# Patient Record
Sex: Female | Born: 1980 | Hispanic: No | Marital: Married | Smoking: Never smoker
Health system: Southern US, Community
[De-identification: ages and names within clinical notes are randomized; demographics above are authoritative.]

## PROBLEM LIST (undated history)

## (undated) DIAGNOSIS — I872 Venous insufficiency (chronic) (peripheral): Secondary | ICD-10-CM

## (undated) DIAGNOSIS — I82409 Acute embolism and thrombosis of unspecified deep veins of unspecified lower extremity: Secondary | ICD-10-CM

## (undated) DIAGNOSIS — G43909 Migraine, unspecified, not intractable, without status migrainosus: Secondary | ICD-10-CM

---
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
## 2013-02-10 LAB — OB RESULTS CONSOLE ANTIBODY SCREEN: Antibody Screen: NEGATIVE

## 2013-02-10 LAB — OB RESULTS CONSOLE RUBELLA ANTIBODY, IGM: Rubella: IMMUNE

## 2013-02-10 LAB — OB RESULTS CONSOLE ABO/RH

## 2013-02-10 LAB — OB RESULTS CONSOLE RPR: RPR: NONREACTIVE

## 2013-02-10 LAB — OB RESULTS CONSOLE HGB/HCT, BLOOD
HCT: 32 %
Hemoglobin: 10.5 g/dL

## 2013-02-21 ENCOUNTER — Inpatient Hospital Stay (HOSPITAL_COMMUNITY): Admission: AD | Admit: 2013-02-21 | Payer: Self-pay | Source: Ambulatory Visit | Admitting: Obstetrics and Gynecology

## 2013-06-28 ENCOUNTER — Inpatient Hospital Stay (HOSPITAL_COMMUNITY): Payer: BC Managed Care – PPO

## 2013-06-28 ENCOUNTER — Encounter (HOSPITAL_COMMUNITY): Payer: Self-pay | Admitting: *Deleted

## 2013-06-28 ENCOUNTER — Inpatient Hospital Stay (HOSPITAL_COMMUNITY)
Admission: AD | Admit: 2013-06-28 | Discharge: 2013-07-07 | DRG: 775 | Disposition: A | Discharge status: Home / Self Care | Payer: BC Managed Care – PPO | Source: Ambulatory Visit | Attending: Obstetrics and Gynecology | Admitting: Obstetrics and Gynecology

## 2013-06-28 DIAGNOSIS — O9903 Anemia complicating the puerperium: Secondary | ICD-10-CM | POA: Diagnosis not present

## 2013-06-28 DIAGNOSIS — D62 Acute posthemorrhagic anemia: Secondary | ICD-10-CM | POA: Diagnosis not present

## 2013-06-28 DIAGNOSIS — O4290 Premature rupture of membranes, unspecified as to length of time between rupture and onset of labor, unspecified weeks of gestation: Principal | ICD-10-CM | POA: Diagnosis present

## 2013-06-28 HISTORY — DX: Migraine, unspecified, not intractable, without status migrainosus: G43.909

## 2013-06-28 HISTORY — DX: Venous insufficiency (chronic) (peripheral): I87.2

## 2013-06-28 HISTORY — DX: Acute embolism and thrombosis of unspecified deep veins of unspecified lower extremity: I82.409

## 2013-06-28 LAB — CBC
HCT: 30.5 % — ABNORMAL LOW (ref 36.0–46.0)
MCV: 92.1 fL (ref 78.0–100.0)
Platelets: 255 10*3/uL (ref 150–400)
RDW: 13.2 % (ref 11.5–15.5)
WBC: 12 10*3/uL — ABNORMAL HIGH (ref 4.0–10.5)

## 2013-06-28 LAB — RPR: RPR Ser Ql: NONREACTIVE

## 2013-06-28 MED ORDER — PRENATAL MULTIVITAMIN CH
1.0000 | ORAL_TABLET | Freq: Every day | ORAL | Status: DC
  Administered 2013-06-29 – 2013-07-05 (×7): 1 via ORAL
  Filled 2013-06-28 (×8): qty 1

## 2013-06-28 MED ORDER — ERYTHROMYCIN LACTOBIONATE 500 MG IV SOLR
250.0000 mg | Freq: Four times a day (QID) | INTRAVENOUS | Status: DC
  Administered 2013-06-28: 250 mg via INTRAVENOUS
  Filled 2013-06-28 (×3): qty 5

## 2013-06-28 MED ORDER — CALCIUM CARBONATE ANTACID 500 MG PO CHEW
2.0000 | CHEWABLE_TABLET | ORAL | Status: DC | PRN
  Administered 2013-06-30 – 2013-07-03 (×3): 400 mg via ORAL
  Filled 2013-06-28 (×3): qty 1

## 2013-06-28 MED ORDER — LACTATED RINGERS IV SOLN
INTRAVENOUS | Status: DC
  Administered 2013-06-28 – 2013-06-29 (×3): via INTRAVENOUS
  Administered 2013-06-29: 100 mL/h via INTRAVENOUS
  Administered 2013-07-02 – 2013-07-03 (×2): via INTRAVENOUS

## 2013-06-28 MED ORDER — MAGNESIUM SULFATE BOLUS VIA INFUSION
4.0000 g | Freq: Once | INTRAVENOUS | Status: AC
  Administered 2013-06-28: 4 g via INTRAVENOUS
  Filled 2013-06-28: qty 500

## 2013-06-28 MED ORDER — ZOLPIDEM TARTRATE 5 MG PO TABS
5.0000 mg | ORAL_TABLET | Freq: Every evening | ORAL | Status: DC | PRN
  Administered 2013-06-29: 5 mg via ORAL
  Filled 2013-06-28: qty 1

## 2013-06-28 MED ORDER — DEXTROSE 5 % IV SOLN
500.0000 mg | INTRAVENOUS | Status: AC
  Administered 2013-06-28 – 2013-06-29 (×2): 500 mg via INTRAVENOUS
  Filled 2013-06-28 (×2): qty 500

## 2013-06-28 MED ORDER — DEXTROSE 5 % IV SOLN: 500.0000 mg | Freq: Once | INTRAVENOUS | Status: DC

## 2013-06-28 MED ORDER — AMOXICILLIN 500 MG PO CAPS
500.0000 mg | ORAL_CAPSULE | Freq: Three times a day (TID) | ORAL | Status: AC
  Administered 2013-06-30 – 2013-07-05 (×14): 500 mg via ORAL
  Filled 2013-06-28 (×15): qty 1

## 2013-06-28 MED ORDER — ACETAMINOPHEN 325 MG PO TABS
650.0000 mg | ORAL_TABLET | ORAL | Status: DC | PRN
  Administered 2013-07-02 – 2013-07-05 (×3): 650 mg via ORAL
  Filled 2013-06-28 (×3): qty 2

## 2013-06-28 MED ORDER — BETAMETHASONE SOD PHOS & ACET 6 (3-3) MG/ML IJ SUSP
12.0000 mg | INTRAMUSCULAR | Status: AC
  Administered 2013-06-28 – 2013-06-29 (×2): 12 mg via INTRAMUSCULAR
  Filled 2013-06-28 (×2): qty 2

## 2013-06-28 MED ORDER — MAGNESIUM SULFATE 40 G IN LACTATED RINGERS - SIMPLE
2.0000 g/h | INTRAVENOUS | Status: DC
  Administered 2013-06-28: 2 g/h via INTRAVENOUS
  Filled 2013-06-28: qty 500

## 2013-06-28 MED ORDER — DOCUSATE SODIUM 100 MG PO CAPS
100.0000 mg | ORAL_CAPSULE | Freq: Every day | ORAL | Status: DC
  Administered 2013-06-29 – 2013-06-30 (×2): 100 mg via ORAL
  Filled 2013-06-28 (×2): qty 1

## 2013-06-28 MED ORDER — ERYTHROMYCIN BASE 250 MG PO TABS: 250.0000 mg | ORAL_TABLET | Freq: Four times a day (QID) | ORAL | Status: DC

## 2013-06-28 MED ORDER — AZITHROMYCIN 500 MG PO TABS: 500.0000 mg | ORAL_TABLET | Freq: Every day | ORAL | Status: DC

## 2013-06-28 MED ORDER — SODIUM CHLORIDE 0.9 % IV SOLN
2.0000 g | Freq: Four times a day (QID) | INTRAVENOUS | Status: AC
  Administered 2013-06-28 – 2013-06-30 (×8): 2 g via INTRAVENOUS
  Filled 2013-06-28 (×8): qty 2000

## 2013-06-28 MED ORDER — AZITHROMYCIN 500 MG PO TABS
500.0000 mg | ORAL_TABLET | Freq: Every day | ORAL | Status: AC
  Administered 2013-06-30 – 2013-07-04 (×5): 500 mg via ORAL
  Filled 2013-06-28 (×5): qty 1

## 2013-06-28 NOTE — H&P (Addendum)
OB ADMISSION/ HISTORY & PHYSICAL:  Admission Date: 06/28/2013 12:27 PM  Admit Diagnosis: 29.[redacted] weeks gestation, PPROM  Pam Lynch is a 32 y.o. female presenting for SROM @0430  on 06/28/13. Evaluated in office with confirmation of ROM, cervix visually 1cm per Dr. Cherly Hensen.  Prenatal History: [id number]   EDC: 09/10/2013 by LMP Prenatal care at [facility]  Primary Ob Provider: Dr. Cherly Hensen Prenatal course complicated by migraine, anemia  Prenatal Labs: ABO, Rh:   A Pos Antibody:  Neg Rubella:   Immune  RPR:   NR HBsAg:   Neg HIV:   Neg GBS:   pending 1 hr GTT : WNL  Medical / Surgical History :  Past medical history: History reviewed. No pertinent past medical history.   Past surgical history:  Past Surgical History  Procedure Laterality Date  . Breast lumpectomy Left 2001    Family History:  Family History  Problem Relation Age of Onset  . Depression Mother   . Heart disease Mother   . Depression Maternal Grandmother   . Diabetes Maternal Grandmother   . Hyperlipidemia Maternal Grandmother   . Hyperlipidemia Paternal Grandmother      Social History:  reports that she has never smoked. She does not have any smokeless tobacco history on file. She reports that she does not drink alcohol or use illicit drugs.  Allergies: Review of patient's allergies indicates no known allergies.   Current Medications at time of admission:  Prior to Admission medications   Medication Sig Start Date End Date Taking? Authorizing Provider  acetaminophen (TYLENOL) 500 MG tablet Take 500 mg by mouth every 6 (six) hours as needed for headache.   Yes Historical Provider, MD  calcium carbonate (TUMS - DOSED IN MG ELEMENTAL CALCIUM) 500 MG chewable tablet Chew 2 tablets by mouth 2 (two) times daily as needed for indigestion or heartburn.   Yes Historical Provider, MD  Doxylamine-Pyridoxine (DICLEGIS) 10-10 MG TBEC Take 1 tablet by mouth daily as needed (nausea).   Yes  Historical Provider, MD  Prenatal Vit-Fe Fumarate-FA (PRENATAL MULTIVITAMIN) TABS tablet Take 1 tablet by mouth daily at 12 noon.   Yes Historical Provider, MD  ranitidine (ZANTAC) 75 MG tablet Take 75 mg by mouth daily as needed for heartburn.   Yes Historical Provider, MD     Review of Systems: +LOF, clear, pink tinged No ctx +FM No HA No fever or chills  Physical Exam:  VS: Blood pressure 122/61, pulse 84, temperature 98.5 F (36.9 C), temperature source Oral, resp. rate 18, last menstrual period 12/04/2012, SpO2 97.00%.  General: alert and oriented, appears comfortable Heart: RRR Lungs: Clear lung fields Abdomen: Gravid, soft and non-tender, non-distended / uterus: gravid, non-tender Extremities: no edema  Genitalia / VE:  deferred  FHR: baseline rate 140 / variability mod / accelerations present / occasional variable decelerations TOCO: irregular  Assessment: 29.[redacted] weeks gestation PPROM FHR category II  Plan:  Admit, Magnesium Sulfate for CP prophylaxis, Betamethasone, prophylactic antibiotics, NICU consult. Dr. Cherly Hensen notified of admission / plan of care   Lawernce Pitts MSN, CNM 06/28/2013, 3:32 PM  Addendum: pt known to me and sent for admission after office eval for c/o leakage of fluid. Cervix visually 1.5-2 cm dilated  W/ mucoid yellow material at os. Fluid with some blood. Grossly ruptured.   H&P reviewed. Orders reviewed. Will do magnesium bolus of 2 grams addtionally to fulfill total of 6 grams bolus for seizure prophylaxis. On antibiotics and receiving betamethasone.  AGA, vtx presentation

## 2013-06-28 NOTE — Consult Note (Signed)
Asked to provide prenatal consultation for patient at risk for preterm delivery due to PROM.  Mother is 32y.o. G5 P1 who is now 29 3/[redacted] weeks EGA, with pregnancy previously uncomplicated until she had ROM early this morning (clear fluid). She is not in labor and is not showing signs of infection..  She is being treated with betamethasone, MagSO4 for CP prophylaxis and antibiotics.  Discussed usual expectations for preterm infant at 29+ weeks gestation, including possible needs for DR resuscitation, respiratory support, IV access, and blood products.  Presented generally optimistic prognosis although acknowledging risk of death or serious morbidity, including brain hemorrhage, intestinal disorders, infection.  Projected possible length of stay in NICU until 36 - [redacted] wks EGA.  Discussed advantages of feeding with mother's milk, she plans to breast feed and will pump postnatally.  Patient was attentive, had appropriate questions, and was appreciative of my input.  Offered NICU tour for father of baby.  Thank you for the consultation.

## 2013-06-29 MED ORDER — MAGNESIUM SULFATE 40 G IN LACTATED RINGERS - SIMPLE
2.0000 g/h | INTRAVENOUS | Status: DC
  Administered 2013-06-29: 2 g/h via INTRAVENOUS
  Filled 2013-06-29 (×2): qty 500

## 2013-06-29 MED ORDER — SALINE SPRAY 0.65 % NA SOLN
1.0000 | NASAL | Status: DC | PRN
Start: 2013-06-29 — End: 2013-07-05
  Administered 2013-06-29: 1 via NASAL
  Filled 2013-06-29: qty 44

## 2013-06-29 MED ORDER — MAGNESIUM SULFATE 40 MG/ML IJ SOLN
2.0000 g | Freq: Once | INTRAMUSCULAR | Status: DC
  Filled 2013-06-29: qty 50

## 2013-06-29 MED ORDER — MAGNESIUM SULFATE BOLUS VIA INFUSION
2.0000 g | Freq: Once | INTRAVENOUS | Status: AC
  Administered 2013-06-29: 2 g via INTRAVENOUS
  Filled 2013-06-29: qty 500

## 2013-06-29 NOTE — Progress Notes (Signed)
HD #2  29 4/7 wks( EDC 09/10/2013) PPROM( Amp/azithromycin) BMZ 12/30, 12/31  S: (+) FM denies ctx (+) leakage of fluid  O: VS 98.1 BP 99/57 P 92 Lungs clear to A Cor RRR Abd: gravid, soft nontender Pad no drainage Extremity: no edema or calf tenderness  sono yesterday: low nl fluid, vtx 3lb 1 oz( 53%)  Tracing:  Baseline 120 min variability no ctx GBS pending(office)  Seen by Nicu( note appreciated)  IMP: PPROM@ 29 4/7 weeks On Magnesium for seizure prophylaxis. S/P BMZ #1  Currently day IV antibiotics x 48 hrs   P) cont inpt status. Await gbs cx. Monitor for infection. Complete 5 day course of oral antibiotics as well.2nd dose of BMZ today. Also complete 12 hr of magnesium for neuroprophylaxis. Deliver for labor/infection or 34 weeks

## 2013-06-30 MED ORDER — DOCUSATE SODIUM 100 MG PO CAPS
100.0000 mg | ORAL_CAPSULE | Freq: Two times a day (BID) | ORAL | Status: DC
  Administered 2013-06-30 – 2013-07-05 (×10): 100 mg via ORAL
  Filled 2013-06-30 (×10): qty 1

## 2013-06-30 NOTE — L&D Delivery Note (Signed)
Delivery Note  First Stage: Labor onset: 1620 Augmentation : none Analgesia /Anesthesia intrapartum: none PPROM at 0430 on 06/28/2013  Second Stage: Complete dilation at 1623 Onset of pushing at 1623 FHR second stage 150 bpm  Delivery of a viable female at [id number] by RN in LOA position Cord double clamped after cessation of pulsation, cut by RN Cord blood sample collected by RN   Third Stage: Placenta delivered Tomasa BlaseSchultz intact with 3 VC @ 1642 by CNM Placenta disposition: pathology Uterine tone firm / bleeding minimal  No laceration identified  Anesthesia for repair: none Repair none Est. Blood Loss (mL): 200  Complications: PPROM  Mom to WU.  Baby to NICU.  Newborn: Birth Weight: 3lbs 3.5oz  Apgar Scores: pending Feeding planned: breastfeeding  *Dr. Cherly Hensenousins notified of delivery / NICU admission  Kenard GowerDAWSON, Zaide Kardell, M  MSN, CNM 07/05/2013, 4:59 PM

## 2013-06-30 NOTE — Progress Notes (Signed)
HD #2  29 5/7 wks( EDC 09/10/2013) PPROM( Amp/azithromycin) BMZ 12/30, 12/31  S: (+) FM denies ctx  Continued LOF Occ spotting when using bathroom No CP or SOB   O:  Patient Vitals for the past 24 hrs:  BP Temp Temp src Pulse Resp  06/30/13 1205 122/46 mmHg 98.4 F (36.9 C) Oral 91 18  06/30/13 0743 104/49 mmHg 98.5 F (36.9 C) Oral 76 18  06/29/13 2335 93/39 mmHg 98 F (36.7 C) Oral 75 18  06/29/13 1949 108/54 mmHg 98.2 F (36.8 C) Oral 83 18  06/29/13 1912 - - - - 20  06/29/13 1805 - - - - 18  06/29/13 1706 109/55 mmHg 98.2 F (36.8 C) Oral 88 20   WDWN female in NAD Neck : supple with FROM Lungs CTA Cor RRR Abd: gravid, soft, fundus nontender Pad no drainage Extremity: no edema or calf tenderness No CVAT Neuro: nonfocal Skin : intact  sono 12/30- low nl fluid, vtx, 3lb 1 oz( 53%)  Tracing:  Baseline 140-150, BTBV 5-25, occ 10x10 accel, occ variable, no ctxs noted. GBS pending(office)  Seen by Nicu  IMP: PPROM@ 29 5/7 weeks- no s/s chorio Mg neuroprotection complete BMZ complete PO abx regimen   P) cont inpt status.  Await gbs cx. Change to 1hr EFM and TOCO q shift Monitor s/s chorio and PTL Sono q 3-4 weeks Deliver as indicated or at 34 weeks

## 2013-06-30 NOTE — Progress Notes (Signed)
Monitors off per orders

## 2013-07-01 MED ORDER — POLYETHYLENE GLYCOL 3350 17 G PO PACK
17.0000 g | PACK | Freq: Every day | ORAL | Status: DC | PRN
  Administered 2013-07-02: 17 g via ORAL
  Filled 2013-07-01 (×3): qty 1

## 2013-07-01 MED ORDER — SODIUM CHLORIDE 0.9 % IJ SOLN
3.0000 mL | Freq: Two times a day (BID) | INTRAMUSCULAR | Status: DC
  Administered 2013-07-01 – 2013-07-05 (×7): 3 mL via INTRAVENOUS

## 2013-07-01 NOTE — Progress Notes (Signed)
HD #3  29 6/7 wks( EDC 09/10/2013) PPROM on oral antibiotics( amox/azithromycin) BMZ 12/30, 12/31  S: (+) FM had some small ctx (+) leakage of fluid( blood tinged) (+) BM yesterday  O: VS 98.4 BP 115/52 P82  Lungs clear to A Cor RRR Abd: gravid, soft nontender Pad light pink fluid no odor Extremity: no edema or calf tenderness    Tracing:  Baseline140 BPM (+)_ accel to 150   No ctx GBS cx( 12/30)    IMP: PPROM@ 29 6/7 weeks S/P Magnesium for seizure prophylaxis. S/P BMZ  On oral antibiotics for 5 days total   P) cont inpt status. Cont oral antibiotics.  Check GBS status.  Monitor for infection.  Will do miralax daily  Deliver for labor/infection or 34 weeks

## 2013-07-02 ENCOUNTER — Inpatient Hospital Stay (HOSPITAL_COMMUNITY): Payer: BC Managed Care – PPO

## 2013-07-02 ENCOUNTER — Encounter (HOSPITAL_COMMUNITY): Payer: Self-pay

## 2013-07-02 LAB — TYPE AND SCREEN
ABO/RH(D): A POS
ANTIBODY SCREEN: NEGATIVE

## 2013-07-02 LAB — CBC WITH DIFFERENTIAL/PLATELET
BASOS PCT: 0 % (ref 0–1)
Basophils Absolute: 0 10*3/uL (ref 0.0–0.1)
Eosinophils Absolute: 0.1 10*3/uL (ref 0.0–0.7)
Eosinophils Relative: 1 % (ref 0–5)
HEMATOCRIT: 30.8 % — AB (ref 36.0–46.0)
Hemoglobin: 10.3 g/dL — ABNORMAL LOW (ref 12.0–15.0)
Lymphocytes Relative: 14 % (ref 12–46)
Lymphs Abs: 1.6 10*3/uL (ref 0.7–4.0)
MCH: 31.2 pg (ref 26.0–34.0)
MCHC: 33.4 g/dL (ref 30.0–36.0)
MCV: 93.3 fL (ref 78.0–100.0)
MONO ABS: 0.8 10*3/uL (ref 0.1–1.0)
MONOS PCT: 7 % (ref 3–12)
NEUTROS ABS: 9.3 10*3/uL — AB (ref 1.7–7.7)
NEUTROS PCT: 79 % — AB (ref 43–77)
Platelets: 253 10*3/uL (ref 150–400)
RBC: 3.3 MIL/uL — ABNORMAL LOW (ref 3.87–5.11)
RDW: 13.6 % (ref 11.5–15.5)
WBC: 11.8 10*3/uL — ABNORMAL HIGH (ref 4.0–10.5)

## 2013-07-02 LAB — PROTIME-INR
INR: 1.01 (ref 0.00–1.49)
Prothrombin Time: 13.1 seconds (ref 11.6–15.2)

## 2013-07-02 LAB — APTT: aPTT: 23 seconds — ABNORMAL LOW (ref 24–37)

## 2013-07-02 LAB — SAVE SMEAR

## 2013-07-02 LAB — ABO/RH: ABO/RH(D): A POS

## 2013-07-02 LAB — FIBRINOGEN: Fibrinogen: 485 mg/dL — ABNORMAL HIGH (ref 204–475)

## 2013-07-02 MED ORDER — MAGNESIUM SULFATE 40 G IN LACTATED RINGERS - SIMPLE
2.0000 g/h | INTRAVENOUS | Status: AC
  Filled 2013-07-02: qty 500

## 2013-07-02 MED ORDER — LACTATED RINGERS IV BOLUS (SEPSIS)
500.0000 mL | Freq: Once | INTRAVENOUS | Status: AC
  Administered 2013-07-02: 500 mL via INTRAVENOUS

## 2013-07-02 MED ORDER — MAGNESIUM SULFATE BOLUS VIA INFUSION
6.0000 g | Freq: Once | INTRAVENOUS | Status: AC
  Administered 2013-07-02: 6 g via INTRAVENOUS
  Filled 2013-07-02: qty 500

## 2013-07-02 MED ORDER — INDOMETHACIN 25 MG PO CAPS
25.0000 mg | ORAL_CAPSULE | Freq: Four times a day (QID) | ORAL | Status: AC
  Administered 2013-07-02 – 2013-07-04 (×8): 25 mg via ORAL
  Filled 2013-07-02 (×8): qty 1

## 2013-07-02 MED ORDER — INDOMETHACIN 50 MG RE SUPP
50.0000 mg | Freq: Once | RECTAL | Status: AC
  Administered 2013-07-02: 50 mg via RECTAL
  Filled 2013-07-02: qty 1

## 2013-07-02 MED ORDER — MAGNESIUM SULFATE 40 G IN LACTATED RINGERS - SIMPLE: 2.0000 g/h | INTRAVENOUS | Status: DC

## 2013-07-02 NOTE — Progress Notes (Signed)
Patient called out, notified RN of increase in spotting/bleeding noted on pad. Pink tinged small amount of blood mixed with fluid seen on pad. Small amount of red blood noted in toilet after patient voided. Patient also reported some cramping. Monitors applied to assess uterine activity & FHR.

## 2013-07-02 NOTE — Progress Notes (Addendum)
CTSP for contractions.  Pt notes onset of contractions about 2 hrs ago, First q 15 min, now up to q 5-10 min. Pt notes good FM and no fevers. Pt notes no pain outside of contractions. Pt does note an increase in bloody d/c over the past 2 hrs, no frank bleeding.   History reviewed. G5P1, prior SVD "early but baby not needing NICU" 8 yrs ago. PPROM 12/30, on oral abx currently, s/p mag sulfate neuroprophylaxs and BMZ. Growth 12/30: vtx, 3'1  PE: 115/58, T 98.2,   Filed Vitals:   07/01/13 1619 07/01/13 1943 07/01/13 2329 07/02/13 0532  BP: 111/55 113/58 113/45 115/58  Pulse: 87 62 84 64  Temp: 97.9 F (36.6 C) 98.2 F (36.8 C) 98.2 F (36.8 C) 98.3 F (36.8 C)  TempSrc: Oral Oral Oral Oral  Resp: 18 18 18 19   Height:      Weight:      SpO2:         Gen: winces with contractions, no distress Abd: no RUQ pain, gravid, NT LE: NT, no edema GU: 10cc blood tinged mucous in vagina, no active bleeding, no odor, cvx appears 1 cm dilated, 50% effaced by SSE, no digital exam done.  Bedside u/s: vtx, no appreciable amniotic fluid.   Toco: ctx q 5-7 min FH: 140's, + accles, no decels, 10 beat variability  A/P: 33 yo G5p1 at 30'0 with PPROM, now starting to have some increase in blood tinge mucous and contractions. No pain, fever, tachycardia or current sx of infection. Will check CBC now. If fetal testing remains reactive, no further bleeding, no evidence abruption and no evidence infection can consider tocolysis, otherwise move to delivery. - Reactive fetal testing, continuous monitoring at this time  La Paz RegionalFOGLEMAN,Pami Wool A. 07/02/2013 5:40 AM    CBC    Component Value Date/Time   WBC 11.8* 07/02/2013 0545   RBC 3.30* 07/02/2013 0545   HGB 10.3* 07/02/2013 0545   HGB 10.5 02/10/2013   HCT 30.8* 07/02/2013 0545   HCT 32 02/10/2013   PLT 253 07/02/2013 0545   MCV 93.3 07/02/2013 0545   MCH 31.2 07/02/2013 0545   MCHC 33.4 07/02/2013 0545   RDW 13.6 07/02/2013 0545   LYMPHSABS 1.6 07/02/2013 0545   MONOABS 0.8 07/02/2013 0545   EOSABS 0.1 07/02/2013 0545   BASOSABS 0.0 07/02/2013 0545     Case d/w Dr. Cherly Hensenousins and care transferred to Dr. Cherly Hensenousins. Planning Mag Sulfate.  Mimie Goering A. 07/02/2013 6:13 AM

## 2013-07-02 NOTE — Progress Notes (Signed)
PPROm 30 weeks 3rd trimester vaginal bleeding PMC on magnesium for neuro prophylaxis again BMZ complete  S: notes less ctx. (+)  Notes bloody vaginal fluid   O: BP 109/54 T98.3 P78  Lungs clear to A Cor RRR Abdomen: soft nontender gravid Pelvic: 3/80/-3 vtx deviated to left Pad blood stained amniotic  fluid Ext (-) edema  Labs reviewed. Wbc 11.8 hgb 10.3 GBS cx neg DIC panel done Tracing: baseline 140 min variability no ctx noted  IMP: PMC/PTL on Magnesium improved w/r to ctx 3rd trimester vaginal bleeding prob marginal separation related to PPROM PPROM on oral antibiotics w/o clinical evidence of chorio( no maternal or fetal tachycardia, nontender abdomen, no change in wbc IUP @ 30 weeks  P) cont with magnesium. sono for BPP, check placenta. Continuous fetal monitoring

## 2013-07-03 LAB — OB RESULTS CONSOLE GBS
GBS: NEGATIVE
GBS: NEGATIVE

## 2013-07-03 MED ORDER — PANTOPRAZOLE SODIUM 40 MG PO TBEC
40.0000 mg | DELAYED_RELEASE_TABLET | Freq: Every day | ORAL | Status: DC
  Administered 2013-07-03 – 2013-07-04 (×2): 40 mg via ORAL
  Filled 2013-07-03 (×4): qty 1

## 2013-07-03 NOTE — Progress Notes (Signed)
PPROM 30  1/7weeks 3rd trimester vaginal bleeding PMC  S/p Mg x 2  Now on indocin BMZ complete  S: notes less ctx. (+)  Notes bloody vaginal fluid   O: BP 113/59 T98.1 P72  Lungs clear to A Cor RRR Abdomen: soft nontender gravid Pelvic: deferred Pad  Small  Amount blood stained amniotic  fluid Ext (-) edema/calf tenderness  Tracing: baseline 150 (+) accel to 160 occ ctx. Small variable  BPP 6/8 GBS cx neg  IMP: PMC/PTL  3rd trimester vaginal bleeding prob marginal separation related to PPROM. Reassuring tracing PPROM on amoxicillin/azithromycin( end on 1/6) IUP @ 30 1/7  weeks  P). Continuous fetal monitoring. HL IV. Complete 48 hrs indocin.

## 2013-07-04 NOTE — Progress Notes (Addendum)
Dr. Cherly Hensenousins at bedside, reviewed FHR strip- EFM okay to be 1 hour NST every shift per order already in chart.

## 2013-07-04 NOTE — Progress Notes (Signed)
Ur chart review completed.  

## 2013-07-04 NOTE — Progress Notes (Signed)
PPROM 30  2/7weeks 3rd trimester vaginal bleeding PMC  S/p Mg x 2  Now on indocin BMZ complete  S: notes less ctx. (+) FM Notes fluid not as bloody C/o feels like scratchy throat. Using saline drops  O: BP 102/40 T98.6 P 95  Lungs clear to A Cor RRR Abdomen: soft nontender gravid Pelvic: deferred Pad scant light tan colored Ext (-) edema/calf tenderness  Tracing: baseline 150  (+) accel Rare ctx  IMP: PMC/PTL  3rd trimester vaginal bleeding prob marginal separation related to PPROM. Rea PPROM on amoxicillin/azithromycin( end on 1/6) IUP @ 30 2/7  weeks  P). Change to TID x 1hr monitoring . May shower.Doreatha Martin. Finish indocin this pm. Humidifier cont input status

## 2013-07-04 NOTE — Progress Notes (Signed)
Antenatal Nutrition Assessment:  Currently  30 2/[redacted] weeks gestation, with PROM. Height  67.5 "  Weight 207 lbs  pre-pregnancy weight 190 lbs .  Pre-pregnancy  BMI 29.7  IBW 137 lbs Total weight gain 17.lbs Weight gain goals 15-25 lbs Estimated needs: 2000-2200 kcal/day, 75-85 grams protein/day, 2.3 liters fluid/day  Antenatal regular diet tolerated well, appetite good. Snack menu provided Current diet prescription will provide for increased needs.  No abnormal nutrition related labs  Nutrition Dx: Increased nutrient needs r/t pregnancy and fetal growth requirements aeb [redacted]  weeks gestation.  No educational needs assessed at this time.  Elisabeth CaraKatherine Loye Vento M.Odis LusterEd. R.D. LDN Neonatal Nutrition Support Specialist Pager [phone]

## 2013-07-05 ENCOUNTER — Encounter (HOSPITAL_COMMUNITY): Payer: Self-pay

## 2013-07-05 LAB — CBC WITH DIFFERENTIAL/PLATELET
Basophils Absolute: 0 10*3/uL (ref 0.0–0.1)
Basophils Relative: 0 % (ref 0–1)
EOS ABS: 0.1 10*3/uL (ref 0.0–0.7)
EOS PCT: 0 % (ref 0–5)
HCT: 31.4 % — ABNORMAL LOW (ref 36.0–46.0)
HEMOGLOBIN: 10.4 g/dL — AB (ref 12.0–15.0)
LYMPHS PCT: 7 % — AB (ref 12–46)
Lymphs Abs: 1.1 10*3/uL (ref 0.7–4.0)
MCH: 30.9 pg (ref 26.0–34.0)
MCHC: 33.1 g/dL (ref 30.0–36.0)
MCV: 93.2 fL (ref 78.0–100.0)
MONO ABS: 1.1 10*3/uL — AB (ref 0.1–1.0)
MONOS PCT: 7 % (ref 3–12)
Neutro Abs: 14.2 10*3/uL — ABNORMAL HIGH (ref 1.7–7.7)
Neutrophils Relative %: 86 % — ABNORMAL HIGH (ref 43–77)
PLATELETS: 249 10*3/uL (ref 150–400)
RBC: 3.37 MIL/uL — ABNORMAL LOW (ref 3.87–5.11)
RDW: 13.8 % (ref 11.5–15.5)
WBC: 16.5 10*3/uL — AB (ref 4.0–10.5)

## 2013-07-05 LAB — COMPREHENSIVE METABOLIC PANEL
ALT: 36 U/L — AB (ref 0–35)
AST: 17 U/L (ref 0–37)
Albumin: 2.5 g/dL — ABNORMAL LOW (ref 3.5–5.2)
Alkaline Phosphatase: 135 U/L — ABNORMAL HIGH (ref 39–117)
BUN: 6 mg/dL (ref 6–23)
CALCIUM: 9.1 mg/dL (ref 8.4–10.5)
CO2: 23 meq/L (ref 19–32)
CREATININE: 0.62 mg/dL (ref 0.50–1.10)
Chloride: 101 mEq/L (ref 96–112)
Glucose, Bld: 81 mg/dL (ref 70–99)
Potassium: 4.3 mEq/L (ref 3.7–5.3)
SODIUM: 138 meq/L (ref 137–147)
Total Bilirubin: 0.7 mg/dL (ref 0.3–1.2)
Total Protein: 6.3 g/dL (ref 6.0–8.3)

## 2013-07-05 LAB — URIC ACID: Uric Acid, Serum: 3.3 mg/dL (ref 2.4–7.0)

## 2013-07-05 MED ORDER — SIMETHICONE 80 MG PO CHEW: 80.0000 mg | CHEWABLE_TABLET | ORAL | Status: DC | PRN

## 2013-07-05 MED ORDER — OXYCODONE-ACETAMINOPHEN 5-325 MG PO TABS
1.0000 | ORAL_TABLET | ORAL | Status: DC | PRN
  Administered 2013-07-06: 1 via ORAL
  Filled 2013-07-05: qty 1

## 2013-07-05 MED ORDER — ONDANSETRON HCL 4 MG/2ML IJ SOLN: 4.0000 mg | INTRAMUSCULAR | Status: DC | PRN

## 2013-07-05 MED ORDER — IBUPROFEN 600 MG PO TABS
600.0000 mg | ORAL_TABLET | Freq: Four times a day (QID) | ORAL | Status: DC
  Administered 2013-07-05: 600 mg via ORAL
  Filled 2013-07-05: qty 1

## 2013-07-05 MED ORDER — DIBUCAINE 1 % RE OINT: 1.0000 "application " | TOPICAL_OINTMENT | RECTAL | Status: DC | PRN

## 2013-07-05 MED ORDER — OXYTOCIN 40 UNITS IN LACTATED RINGERS INFUSION - SIMPLE MED
INTRAVENOUS | Status: AC
  Administered 2013-07-05: 40 [IU]
  Filled 2013-07-05: qty 1000

## 2013-07-05 MED ORDER — OXYCODONE-ACETAMINOPHEN 5-325 MG PO TABS
1.0000 | ORAL_TABLET | ORAL | Status: DC | PRN
  Administered 2013-07-05: 1 via ORAL
  Filled 2013-07-05: qty 1

## 2013-07-05 MED ORDER — LANOLIN HYDROUS EX OINT: TOPICAL_OINTMENT | CUTANEOUS | Status: DC | PRN

## 2013-07-05 MED ORDER — PRENATAL MULTIVITAMIN CH
1.0000 | ORAL_TABLET | Freq: Every day | ORAL | Status: DC
  Administered 2013-07-06 – 2013-07-07 (×2): 1 via ORAL
  Filled 2013-07-05 (×2): qty 1

## 2013-07-05 MED ORDER — ZOLPIDEM TARTRATE 5 MG PO TABS: 5.0000 mg | ORAL_TABLET | Freq: Every evening | ORAL | Status: DC | PRN

## 2013-07-05 MED ORDER — SENNOSIDES-DOCUSATE SODIUM 8.6-50 MG PO TABS
2.0000 | ORAL_TABLET | ORAL | Status: DC
  Administered 2013-07-05 – 2013-07-06 (×2): 2 via ORAL
  Filled 2013-07-05 (×2): qty 2

## 2013-07-05 MED ORDER — FERROUS SULFATE 325 (65 FE) MG PO TABS
325.0000 mg | ORAL_TABLET | Freq: Two times a day (BID) | ORAL | Status: DC
  Administered 2013-07-06 – 2013-07-07 (×3): 325 mg via ORAL
  Filled 2013-07-05 (×3): qty 1

## 2013-07-05 MED ORDER — WITCH HAZEL-GLYCERIN EX PADS: 1.0000 "application " | MEDICATED_PAD | CUTANEOUS | Status: DC | PRN

## 2013-07-05 MED ORDER — MEASLES, MUMPS & RUBELLA VAC ~~LOC~~ INJ
0.5000 mL | INJECTION | Freq: Once | SUBCUTANEOUS | Status: DC
Start: 2013-07-06 — End: 2013-07-07

## 2013-07-05 MED ORDER — BENZOCAINE-MENTHOL 20-0.5 % EX AERO
1.0000 | INHALATION_SPRAY | CUTANEOUS | Status: DC | PRN
Start: 2013-07-05 — End: 2013-07-07

## 2013-07-05 MED ORDER — DIPHENHYDRAMINE HCL 25 MG PO CAPS: 25.0000 mg | ORAL_CAPSULE | Freq: Four times a day (QID) | ORAL | Status: DC | PRN

## 2013-07-05 MED ORDER — OXYTOCIN 10 UNIT/ML IJ SOLN
INTRAMUSCULAR | Status: AC
  Filled 2013-07-05: qty 1

## 2013-07-05 MED ORDER — ONDANSETRON HCL 4 MG PO TABS: 4.0000 mg | ORAL_TABLET | ORAL | Status: DC | PRN

## 2013-07-05 MED ORDER — IBUPROFEN 600 MG PO TABS
600.0000 mg | ORAL_TABLET | Freq: Four times a day (QID) | ORAL | Status: DC
  Administered 2013-07-05 – 2013-07-07 (×7): 600 mg via ORAL
  Filled 2013-07-05 (×7): qty 1

## 2013-07-05 NOTE — Progress Notes (Signed)
PPROM D9 30  3/7weeks S/p 3rd trimester vaginal bleeding PMC  S/p Mg x 2, indocin BMZ complete  S: c/o some ctx. (+) FM  Feels baby is coming today.   O: BP 111/58 T99.1 P 88  Lungs clear to A Cor RRR Abdomen: soft  Sl tender on right side only gravid Pelvic: deferred Pad scant light tan colored w/ fleck of pink Ext (-) edema/calf tenderness  Tracing; earlier: baseline 145 had one deep variable dece down to 60 x 50 secs   irreg  ctx  IMP: PPROM on amoxicillin/azithromycin( ends today) IUP @ 30 3/7  Weeks PMC  P).  cont input status. Will put on monitor now. Cbc w/ diff now. Watch for labor or evidence of chorio

## 2013-07-06 LAB — CBC
HCT: 27.9 % — ABNORMAL LOW (ref 36.0–46.0)
HEMOGLOBIN: 9.5 g/dL — AB (ref 12.0–15.0)
MCH: 31.5 pg (ref 26.0–34.0)
MCHC: 34.1 g/dL (ref 30.0–36.0)
MCV: 92.4 fL (ref 78.0–100.0)
Platelets: 242 10*3/uL (ref 150–400)
RBC: 3.02 MIL/uL — ABNORMAL LOW (ref 3.87–5.11)
RDW: 13.6 % (ref 11.5–15.5)
WBC: 15.3 10*3/uL — ABNORMAL HIGH (ref 4.0–10.5)

## 2013-07-06 MED ORDER — TETANUS-DIPHTH-ACELL PERTUSSIS 5-2.5-18.5 LF-MCG/0.5 IM SUSP
0.5000 mL | Freq: Once | INTRAMUSCULAR | Status: AC
  Administered 2013-07-06: 0.5 mL via INTRAMUSCULAR

## 2013-07-06 NOTE — Progress Notes (Signed)
PPD 1 SVD - PTB at 30 weeks    VS: BP 109/66  Pulse 86  Temp(Src) 98.7 F (37.1 C) (Oral)  Resp 18  Ht 5' 7.5" (1.715 m)  Wt 87.544 kg (193 lb)  BMI 29.76 kg/m2  SpO2 98%  LMP 12/04/2012   LABS:              Recent Labs  07/05/13 1510 07/06/13 0516  WBC 16.5* 15.3*  HGB 10.4* 9.5*  PLT 249 242               Blood type: --/--/A POS, A POS (01/03 [id number])  Rubella: Immune (08/14 0000)                     I&O: Intake/Output     01/06 0701 - 01/07 0700 01/07 0701 - 01/08 0700   I.V. (mL/kg)     Total Intake(mL/kg)     Blood 200    Total Output 200     Net -200           A: PPD # 1   P: Routine post partum orders  Revisit for physical exam when patient returns from NICU  Marlinda MikeBAILEY, Shironda Kain CNM, MSN, [facility] HospitalFACNM 07/06/2013, 9:54 AM   Addendum for 07/06/2013 @ 15000  Patient remains in NICU Will revisit ~ supper to exam and assess patient.  Marlinda Mikeanya Laverle Pillard CNM / [facility] CenterFACNM

## 2013-07-06 NOTE — Progress Notes (Signed)
PPD 1 SVD  S:  Reports feeling well - tired             Tolerating po/ No nausea or vomiting             Bleeding is light             Pain controlled with motrin             Up ad lib / ambulatory / voiding QS  Newborn stable on RA in NICU O:               VS: BP 120/76  Pulse 83  Temp(Src) 98.2 F (36.8 C) (Oral)  Resp 18  Ht 5' 7.5" (1.715 m)  Wt 87.544 kg (193 lb)  BMI 29.76 kg/m2  SpO2 98%  LMP 12/04/2012   LABS:              Recent Labs  07/05/13 1510 07/06/13 0516  WBC 16.5* 15.3*  HGB 10.4* 9.5*  PLT 249 242               Blood type: --/--/A POS, A POS (01/03 [id number])  Rubella: Immune (08/14 0000)                          Physical Exam:             Alert and oriented X3  Abdomen: soft, non-tender, non-distended              Fundus: firm, non-tender, U-2  Perineum: intact  Lochia: light   Extremities: no edema, no calf pain or tenderness    A: PPD # 1 - PTB @ 30 weeks  Doing well - stable status  P: Routine post partum orders    Marlinda MikeBAILEY, Japleen Tornow CNM, MSN, FACNM 07/06/2013, 2140pm

## 2013-07-06 NOTE — Lactation Note (Signed)
This note was copied from the chart of Pam Lynch. Lactation Consultation Note Initial consult  With this mom of a NICU baby, 30 4/7 weeks corrected gestation, and 22 hours post partum. i encouraged mom to pump every 3 hours. i showed her how to hand express, and she was able to express at least 1-2 mls of colostrum. Teaching done from the NICU booklet. Mom has breast fed her first child, and is eager to provide EBm for this baby. She is aware ai will help her transition this baby to breast feeding. i will follow this family in the nICU. Mom will need to rent a DEP tomorrow, and paper work given to her.  Patient Name: Pam Karle PlumberCourtney Orner [id number]'s Date: 07/06/2013 Reason for consult: Initial assessment;NICU baby   Maternal Data Formula Feeding for Exclusion: Yes (baby in NICU) Infant to breast within first hour of birth: No Breastfeeding delayed due to:: Infant status Has patient been taught Hand Expression?: Yes Does the patient have breastfeeding experience prior to this delivery?: Yes  Feeding Feeding Type: Formula Length of feed: 10 min  LATCH Score/Interventions                      Lactation Tools Discussed/Used Tools: Pump Breast pump type: Double-Electric Breast Pump WIC Program: No Pump Review: Setup, frequency, and cleaning;Milk Storage;Other (comment) (hand exp, teaching from NICU booklet ) Initiated by:: bedside RN Date initiated:: 07/05/13   Consult Status Consult Status: Follow-up Date: 07/07/13 Follow-up type: In-patient    Alfred LevinsLee, Verdelle Valtierra Anne 07/06/2013, 5:27 PM

## 2013-07-07 MED ORDER — FERROUS SULFATE 325 (65 FE) MG PO TABS: 325.0000 mg | ORAL_TABLET | Freq: Two times a day (BID) | ORAL | Status: AC

## 2013-07-07 MED ORDER — IBUPROFEN 600 MG PO TABS: 600.0000 mg | ORAL_TABLET | Freq: Four times a day (QID) | ORAL | Status: AC

## 2013-07-07 NOTE — Lactation Note (Signed)
This note was copied from the chart of Pam Karle PlumberCourtney Ho. Lactation Consultation Note    Follow up consult with this mom of a NICU baby, 30 5/7 weeks corrected gestation and 47 hours post partum. Mom's milk is transitioning in well, expressing about 30-45 mls  At a pumping now. I rented mom a dEP and instructed her in it's use. I will follow this family in the nICU.  Patient Name: Pam Karle PlumberCourtney Lynch [id number]'s Date: 07/07/2013 Reason for consult: Follow-up assessment;NICU baby   Maternal Data    Feeding Feeding Type: Breast Milk  LATCH Score/Interventions                      Lactation Tools Discussed/Used     Consult Status Consult Status: PRN Follow-up type:  (in NICU)    Alfred LevinsLee, Dhruti Ghuman Anne 07/07/2013, 4:10 PM
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
## 2013-07-07 NOTE — Progress Notes (Signed)
PPD #2- SVD  Subjective:   Reports feeling well Tolerating po/ No nausea or vomiting Bleeding is light Pain controlled with Motrin Up ad lib / ambulatory / voiding without problems Newborn: breastpumping, baby in NICU   Objective:   VS: VS:  Filed Vitals:   07/05/13 2211 07/06/13 0602 07/06/13 1156 07/06/13 2059  BP:  109/66 129/71 120/76  Pulse: 80 86 102 83  Temp: 99.1 F (37.3 C) 98.7 F (37.1 C) 98 F (36.7 C) 98.2 F (36.8 C)  TempSrc: Oral Oral Oral Oral  Resp: 18 18 18 18   Height:      Weight:      SpO2: 98% 98% 100% 98%    LABS:  Recent Labs  07/05/13 1510 07/06/13 0516  WBC 16.5* 15.3*  HGB 10.4* 9.5*  PLT 249 242   Blood type: --/--/A POS, A POS (01/03 0658) Rubella: Immune (08/14 0000)                I&O: Intake/Output     01/07 0701 - 01/08 0700 01/08 0701 - 01/09 0700   Blood     Total Output       Net              Physical Exam: Alert and oriented X3 Abdomen: soft, non-tender, non-distended  Fundus: firm, non-tender, U-2 Perineum: intact Lochia: small Extremities: left lower-edema 1+, no calf pain or tenderness    Assessment: PPD # 2 [id number]/ S/P:spontaneous vaginal IDA with compounding ABL anemia Doing well - stable for discharge home   Plan: Discharge home RX's:  Ibuprofen 600mg  po Q 6 hrs prn pain #30 Refill x 0 Ferrous Sulfate BID #60 Refill x 1 Routine pp visit in [employer]6wks [facility] booklet given    Donette LarryBHAMBRI, Deshan Hemmelgarn, N MSN, CNM 07/07/2013, 10:39 AM

## 2013-07-27 ENCOUNTER — Ambulatory Visit: Payer: Self-pay

## 2013-07-27 NOTE — Lactation Note (Signed)
This note was copied from the chart of Pam Lynch. Lactation Consultation Note    Follow up consult with this mom of a NICU baby, now 423 weeks old, and 33 4/7 weeks corrected gestation. Mom was concerned that she was noting a decrease in her milk production. She was going up to 5 hours at night, and not getting in 8 pumpings a day. She was also not staying hydrated. i suggested she go back to every 3 hours around the clock, for pumping, and drink more - to not being thirsty, and I thought this would increase her supply in a few days. Mom knows to have her  baby's  nurse call for more questions/concerns.  Patient Name: Pam Karle PlumberCourtney Peary ZOXWR'UToday's Date: 07/27/2013 Reason for consult: Follow-up assessment;NICU baby   Maternal Data    Feeding Feeding Type: Breast Milk Length of feed: 60 min  LATCH Score/Interventions                      Lactation Tools Discussed/Used     Consult Status Consult Status: Follow-up Follow-up type:  (in NICU)    Alfred LevinsLee, Jymir Dunaj Anne 07/27/2013, 4:43 PM

## 2013-08-02 ENCOUNTER — Ambulatory Visit: Payer: Self-pay

## 2013-08-02 NOTE — Lactation Note (Signed)
This note was copied from the chart of Pam Lynch. Lactation Consultation Note    Follow up consult with this mom and NICU baby, now 224 weeks old, and 34 3/7 weeks corrected gestation. [name] weighs 4 lb 12.7 oz. i assisted mom with latching [name] in football hold. This was her first time latching. [name] latched well, was alert and looking at mom. She suckled and was tolerating being at mom's breast well.  Mom was smiling . I told mom we may need to use a nipple shield in the future, but for now, this is non-nutritive sucking. She was fed via ng during this time. I will follow this mom and baby both in the NICU, and help transition [name] to  Full breast feeding in o/p lactation, after discharge  Patient Name: Pam Lynch ZOXWR'UToday's Date: 08/02/2013 Reason for consult: Follow-up assessment;NICU baby;Infant < 6lbs;Late preterm infant   Maternal Data    Feeding Feeding Type: Breast Milk Length of feed: 30 min  LATCH Score/Interventions Latch: Repeated attempts needed to sustain latch, nipple held in mouth throughout feeding, stimulation needed to elicit sucking reflex. Intervention(s): Skin to skin;Teach feeding cues Intervention(s): Adjust position;Assist with latch;Breast compression  Audible Swallowing: None Intervention(s): Skin to skin;Hand expression  Type of Nipple: Everted at rest and after stimulation (may need nipple shield at a later time - baby under 5 pounds)  Comfort (Breast/Nipple): Soft / non-tender     Hold (Positioning): Assistance needed to correctly position infant at breast and maintain latch. Intervention(s): Breastfeeding basics reviewed;Support Pillows;Position options;Skin to skin  LATCH Score: 6  Lactation Tools Discussed/Used     Consult Status Consult Status: PRN Follow-up type:  (in NICU)    Alfred LevinsLee, Caeleigh Prohaska Anne 08/02/2013, 3:01 PM

## 2014-05-01 ENCOUNTER — Encounter (HOSPITAL_COMMUNITY): Payer: Self-pay

## 2014-11-10 IMAGING — US US OB COMP +14 WK
1 series · 12 of 28 positions shown · non-contrast
Comparison: none

[Series 1: us ob comp +14 wk · 12 of 101 slices shown]
[im 4/101]
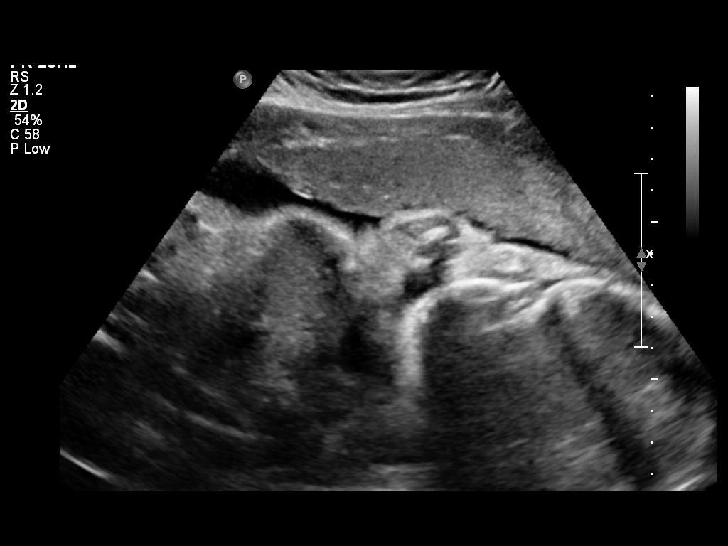
[im 12/101]
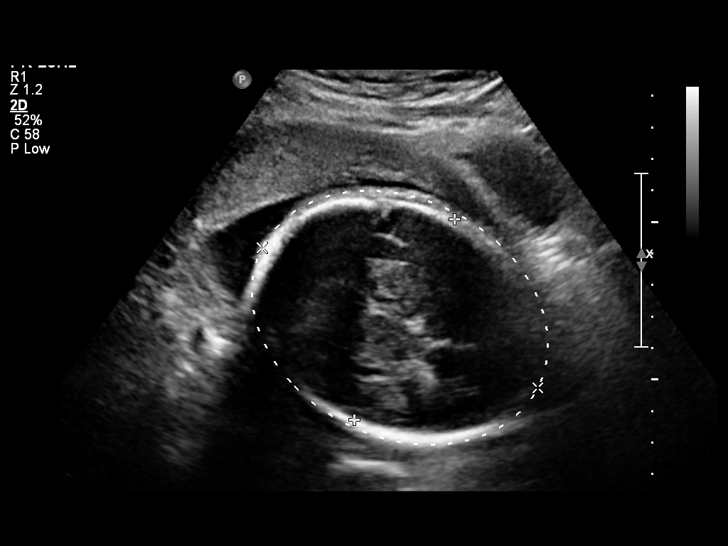
[im 19/101]
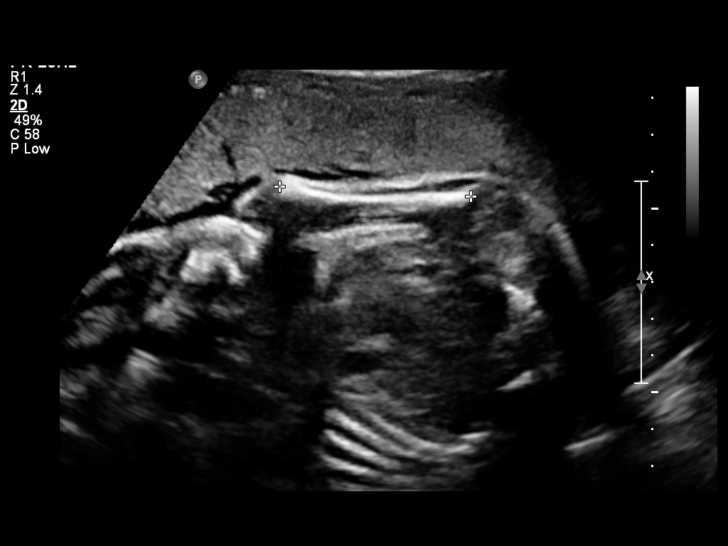
[im 30/101]
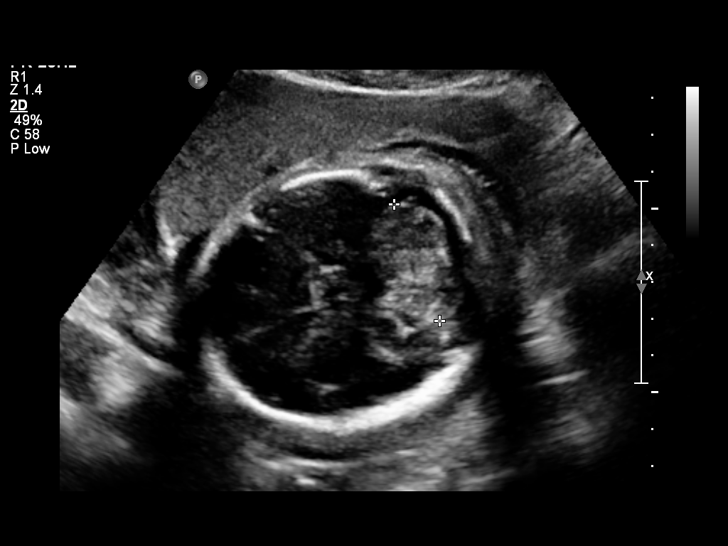
[im 38/101]
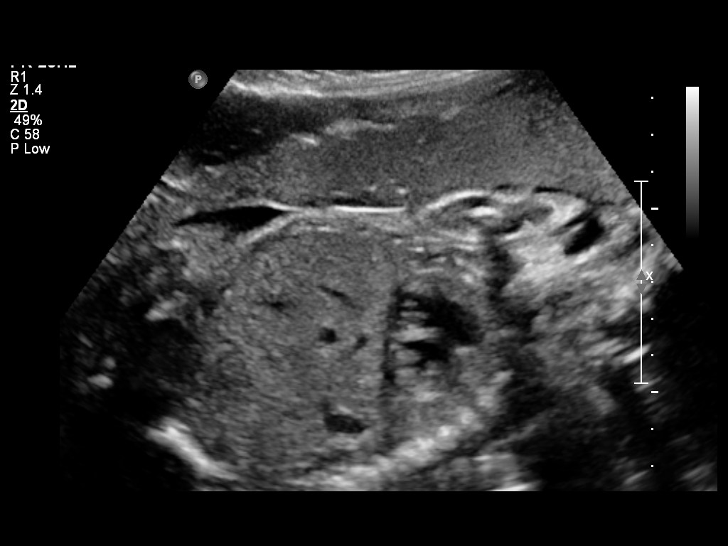
[im 45/101]
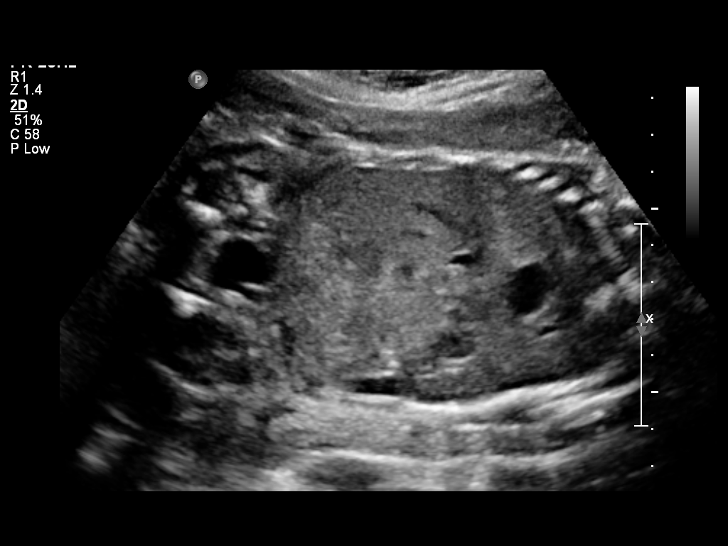
[im 56/101]
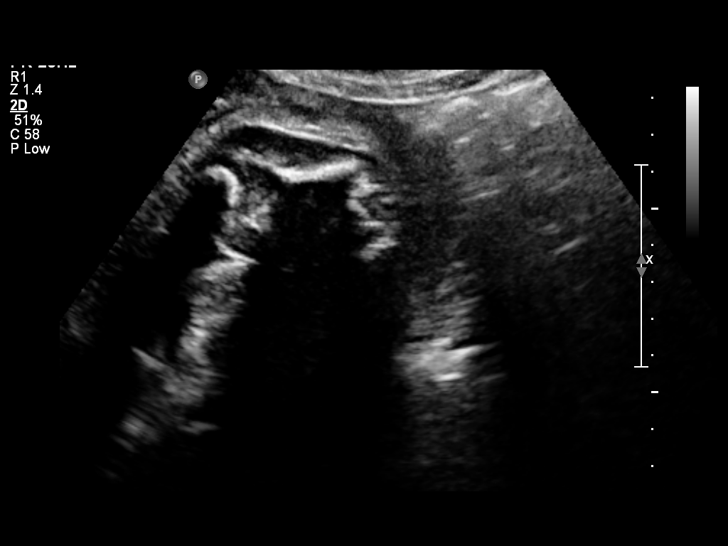
[im 63/101]
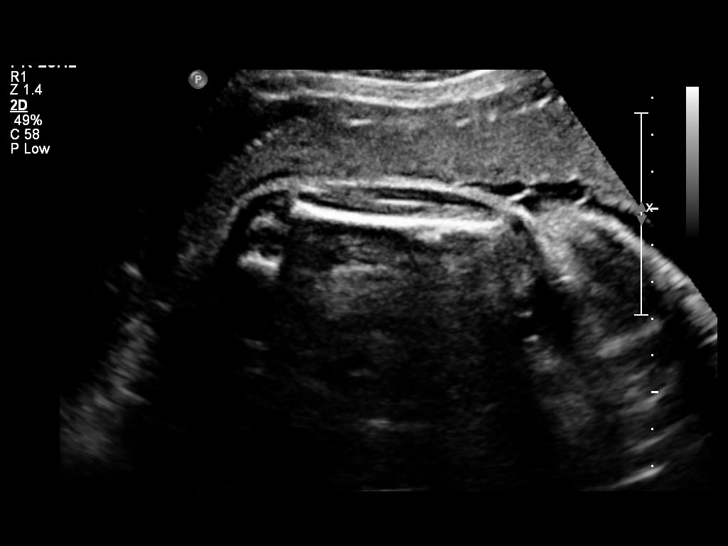
[im 71/101]
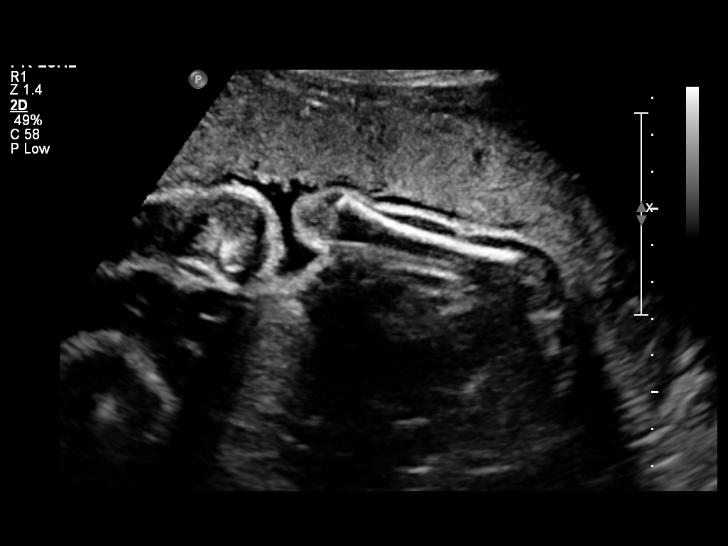
[im 82/101]
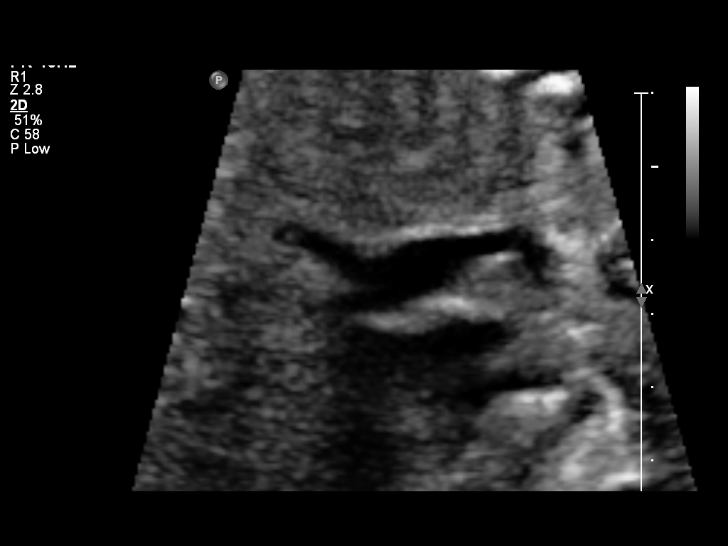
[im 89/101]
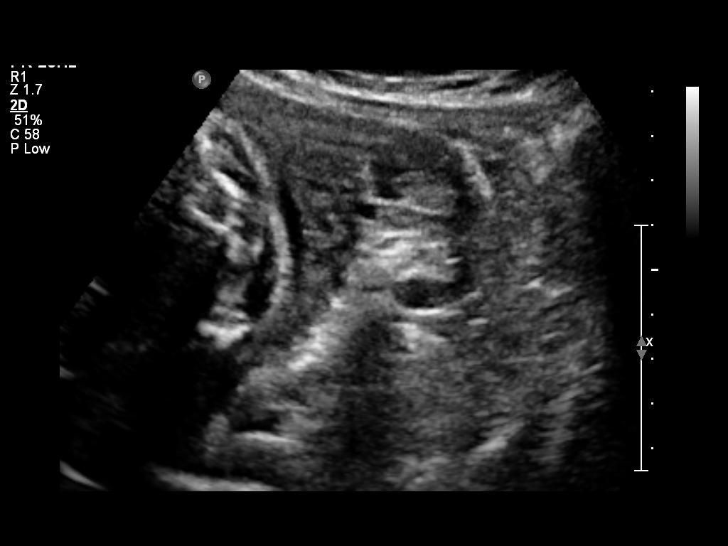
[im 97/101]
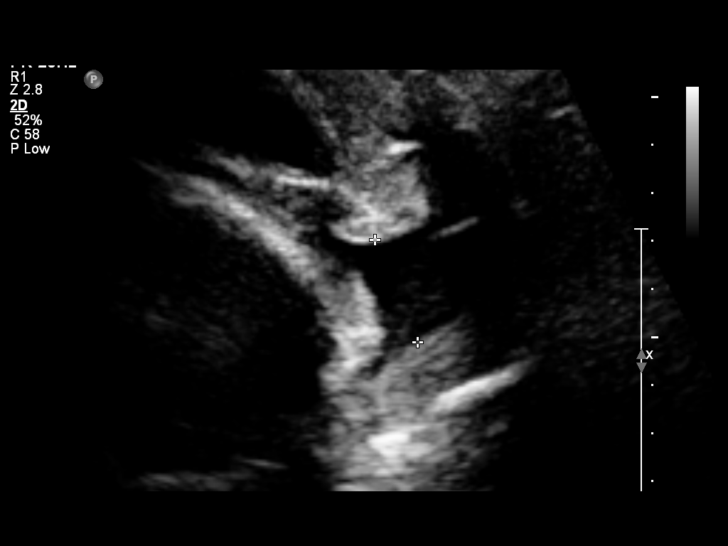

[12 of 28 positions shown; findings below may reference images not displayed]

OBSTETRICS REPORT
                      (Signed Final 06/28/2013 [DATE])

Service(s) Provided

 US OB COMP + 14 WK                                    76805.1
Indications

 Premature rupture of membranes - leaking fluid
Fetal Evaluation

 Num Of Fetuses:    1
 Fetal Heart Rate:  136                          bpm
 Cardiac Activity:  Observed
 Presentation:      Cephalic
 Placenta:          Anterior, above cervical os
 P. Cord            Not well visualized
 Insertion:

 Amniotic Fluid
 AFI FV:      Subjectively low-normal
 AFI Sum:     9.05    cm        5  %Tile     Larg Pckt:    3.89  cm
 RUQ:   3.89    cm   RLQ:    2.88   cm    LUQ:   1.1     cm   LLQ:    1.18   cm
Biometry

 BPD:     69.9  mm     G. Age:  28w 0d                CI:        68.69   70 - 86
                                                      FL/HC:      21.5   19.6 -

 HC:     269.5  mm     G. Age:  29w 2d       17  %    HC/AC:      1.08   0.99 -

 AC:     249.5  mm     G. Age:  29w 1d       37  %    FL/BPD:     83.0   71 - 87
 FL:        58  mm     G. Age:  30w 2d       61  %    FL/AC:      23.2   20 - 24
 HUM:     52.2  mm     G. Age:  30w 3d       68  %
 CER:     33.9  mm     G. Age:  29w 3d       50  %

 Est. FW:    4933  gm      3 lb 1 oz     53  %
Gestational Age

 Clinical EDD:  29w 3d                                        EDD:   09/10/13
 U/S Today:     29w 1d                                        EDD:   09/12/13
 Best:          29w 3d     Det. By:  Clinical EDD             EDD:   09/10/13
Anatomy

 Cranium:          Appears normal         Aortic Arch:      Appears normal
 Fetal Cavum:      Appears normal         Ductal Arch:      Appears normal
 Ventricles:       Appears normal         Diaphragm:        Appears normal
 Choroid Plexus:   Appears normal         Stomach:          Appears normal
 Cerebellum:       Appears normal         Abdomen:          Appears normal
 Posterior Fossa:  Appears normal         Abdominal Wall:   Appears nml (cord
                                                            insert, abd wall)
 Nuchal Fold:      Not applicable (>20    Cord Vessels:     Appears normal (3
                   wks GA)                                  vessel cord)
 Face:             Appears normal         Kidneys:          Appear normal
                   (orbits and profile)
 Lips:             Appears normal         Bladder:          Appears normal
 Heart:            Appears normal         Spine:            Appears normal
                   (4CH, axis, and
                   situs)
 RVOT:             Appears normal         Lower             Appears normal
                                          Extremities:
 LVOT:             Appears normal         Upper             Appears normal
                                          Extremities:

 Other:  Female gender.
Cervix Uterus Adnexa

 Cervical Length:    2.8      cm       Funnel Width:   2.3       cm
 Funnel Length:      2.8      cm

 Cervix:       Measured translabially.  Appears funnelled, see
               comments.

 Left Ovary:    Not visualized.
 Right Ovary:   Within normal limits.
Impression

 Single IUP at 29 [DATE] weeks
 PPROM
 Normal anatomic fetal survey.
 Estimated fetal weight at the 53rd %tile.
 Amniotic fluid subjectively decreased (AFI 9 cm)
 Cervical length 2.8 cm (translabial)
Recommendations

 Recommend ultrasounds every 3-4 weeks while hospitalized.

## 2018-11-25 DIAGNOSIS — F418 Other specified anxiety disorders: Secondary | ICD-10-CM | POA: Diagnosis not present

## 2018-11-25 DIAGNOSIS — R030 Elevated blood-pressure reading, without diagnosis of hypertension: Secondary | ICD-10-CM | POA: Diagnosis not present

## 2018-12-02 DIAGNOSIS — B353 Tinea pedis: Secondary | ICD-10-CM | POA: Diagnosis not present

## 2018-12-02 DIAGNOSIS — B351 Tinea unguium: Secondary | ICD-10-CM | POA: Diagnosis not present

## 2018-12-03 DIAGNOSIS — F418 Other specified anxiety disorders: Secondary | ICD-10-CM | POA: Diagnosis not present

## 2018-12-03 DIAGNOSIS — R030 Elevated blood-pressure reading, without diagnosis of hypertension: Secondary | ICD-10-CM | POA: Diagnosis not present

## 2018-12-03 DIAGNOSIS — E663 Overweight: Secondary | ICD-10-CM | POA: Diagnosis not present

## 2019-01-03 DIAGNOSIS — F329 Major depressive disorder, single episode, unspecified: Secondary | ICD-10-CM | POA: Diagnosis not present

## 2019-01-03 DIAGNOSIS — F419 Anxiety disorder, unspecified: Secondary | ICD-10-CM | POA: Diagnosis not present

## 2019-01-05 DIAGNOSIS — F4322 Adjustment disorder with anxiety: Secondary | ICD-10-CM | POA: Diagnosis not present

## 2019-01-05 DIAGNOSIS — F321 Major depressive disorder, single episode, moderate: Secondary | ICD-10-CM | POA: Diagnosis not present

## 2019-01-31 DIAGNOSIS — F320 Major depressive disorder, single episode, mild: Secondary | ICD-10-CM | POA: Diagnosis not present

## 2019-01-31 DIAGNOSIS — F4322 Adjustment disorder with anxiety: Secondary | ICD-10-CM | POA: Diagnosis not present

## 2019-02-24 DIAGNOSIS — F4322 Adjustment disorder with anxiety: Secondary | ICD-10-CM | POA: Diagnosis not present

## 2019-02-24 DIAGNOSIS — F321 Major depressive disorder, single episode, moderate: Secondary | ICD-10-CM | POA: Diagnosis not present

## 2019-03-08 DIAGNOSIS — L280 Lichen simplex chronicus: Secondary | ICD-10-CM | POA: Diagnosis not present

## 2019-03-08 DIAGNOSIS — L700 Acne vulgaris: Secondary | ICD-10-CM | POA: Diagnosis not present

## 2019-03-08 DIAGNOSIS — L858 Other specified epidermal thickening: Secondary | ICD-10-CM | POA: Diagnosis not present

## 2019-03-14 DIAGNOSIS — F900 Attention-deficit hyperactivity disorder, predominantly inattentive type: Secondary | ICD-10-CM | POA: Diagnosis not present

## 2019-03-14 DIAGNOSIS — F320 Major depressive disorder, single episode, mild: Secondary | ICD-10-CM | POA: Diagnosis not present

## 2019-03-14 DIAGNOSIS — F4322 Adjustment disorder with anxiety: Secondary | ICD-10-CM | POA: Diagnosis not present

## 2019-03-18 DIAGNOSIS — F900 Attention-deficit hyperactivity disorder, predominantly inattentive type: Secondary | ICD-10-CM | POA: Diagnosis not present

## 2019-03-18 DIAGNOSIS — F4322 Adjustment disorder with anxiety: Secondary | ICD-10-CM | POA: Diagnosis not present

## 2019-03-18 DIAGNOSIS — F320 Major depressive disorder, single episode, mild: Secondary | ICD-10-CM | POA: Diagnosis not present

## 2019-04-04 DIAGNOSIS — Z124 Encounter for screening for malignant neoplasm of cervix: Secondary | ICD-10-CM | POA: Diagnosis not present

## 2019-04-04 DIAGNOSIS — Z01419 Encounter for gynecological examination (general) (routine) without abnormal findings: Secondary | ICD-10-CM | POA: Diagnosis not present

## 2019-04-04 DIAGNOSIS — Z1151 Encounter for screening for human papillomavirus (HPV): Secondary | ICD-10-CM | POA: Diagnosis not present

## 2019-04-04 DIAGNOSIS — Z6837 Body mass index (BMI) 37.0-37.9, adult: Secondary | ICD-10-CM | POA: Diagnosis not present

## 2019-04-12 DIAGNOSIS — F320 Major depressive disorder, single episode, mild: Secondary | ICD-10-CM | POA: Diagnosis not present

## 2019-04-12 DIAGNOSIS — F900 Attention-deficit hyperactivity disorder, predominantly inattentive type: Secondary | ICD-10-CM | POA: Diagnosis not present

## 2019-04-12 DIAGNOSIS — F419 Anxiety disorder, unspecified: Secondary | ICD-10-CM | POA: Diagnosis not present

## 2019-04-19 DIAGNOSIS — F4322 Adjustment disorder with anxiety: Secondary | ICD-10-CM | POA: Diagnosis not present

## 2019-04-19 DIAGNOSIS — F900 Attention-deficit hyperactivity disorder, predominantly inattentive type: Secondary | ICD-10-CM | POA: Diagnosis not present

## 2019-04-21 DIAGNOSIS — N643 Galactorrhea not associated with childbirth: Secondary | ICD-10-CM | POA: Diagnosis not present

## 2019-04-21 DIAGNOSIS — Z1329 Encounter for screening for other suspected endocrine disorder: Secondary | ICD-10-CM | POA: Diagnosis not present

## 2019-05-17 DIAGNOSIS — F900 Attention-deficit hyperactivity disorder, predominantly inattentive type: Secondary | ICD-10-CM | POA: Diagnosis not present

## 2019-05-17 DIAGNOSIS — F419 Anxiety disorder, unspecified: Secondary | ICD-10-CM | POA: Diagnosis not present

## 2019-05-17 DIAGNOSIS — F320 Major depressive disorder, single episode, mild: Secondary | ICD-10-CM | POA: Diagnosis not present

## 2019-05-20 DIAGNOSIS — F900 Attention-deficit hyperactivity disorder, predominantly inattentive type: Secondary | ICD-10-CM | POA: Diagnosis not present

## 2019-05-20 DIAGNOSIS — F320 Major depressive disorder, single episode, mild: Secondary | ICD-10-CM | POA: Diagnosis not present

## 2019-05-20 DIAGNOSIS — F4322 Adjustment disorder with anxiety: Secondary | ICD-10-CM | POA: Diagnosis not present

## 2019-06-01 DIAGNOSIS — F320 Major depressive disorder, single episode, mild: Secondary | ICD-10-CM | POA: Diagnosis not present

## 2019-06-01 DIAGNOSIS — F900 Attention-deficit hyperactivity disorder, predominantly inattentive type: Secondary | ICD-10-CM | POA: Diagnosis not present

## 2019-06-01 DIAGNOSIS — F4322 Adjustment disorder with anxiety: Secondary | ICD-10-CM | POA: Diagnosis not present

## 2019-06-14 DIAGNOSIS — N643 Galactorrhea not associated with childbirth: Secondary | ICD-10-CM | POA: Diagnosis not present

## 2023-11-25 ENCOUNTER — Ambulatory Visit: Payer: Self-pay | Admitting: Internal Medicine

## 2024-01-19 ENCOUNTER — Ambulatory Visit: Payer: Self-pay | Admitting: Internal Medicine

## 2024-02-23 ENCOUNTER — Ambulatory Visit: Payer: Self-pay | Admitting: Internal Medicine
